# Patient Record
Sex: Female | Born: 1995 | Race: Black or African American | Hispanic: No | Marital: Single | Smoking: Never smoker
Health system: Southern US, Community
[De-identification: ages and names within clinical notes are randomized; demographics above are authoritative.]

## PROBLEM LIST (undated history)

## (undated) HISTORY — PX: CYST EXCISION: SHX5701

## (undated) HISTORY — PX: KNEE SURGERY: SHX244

---

## 2019-04-01 ENCOUNTER — Other Ambulatory Visit: Payer: Self-pay

## 2019-04-01 ENCOUNTER — Encounter (HOSPITAL_COMMUNITY): Payer: Self-pay | Admitting: Emergency Medicine

## 2019-04-01 ENCOUNTER — Emergency Department (HOSPITAL_COMMUNITY)
Admission: EM | Admit: 2019-04-01 | Discharge: 2019-04-01 | Disposition: A | Discharge status: Home / Self Care | Payer: Self-pay | Attending: Emergency Medicine | Admitting: Emergency Medicine

## 2019-04-01 DIAGNOSIS — R112 Nausea with vomiting, unspecified: Secondary | ICD-10-CM | POA: Insufficient documentation

## 2019-04-01 LAB — CBC WITH DIFFERENTIAL/PLATELET
Abs Immature Granulocytes: 0.07 10*3/uL (ref 0.00–0.07)
Basophils Absolute: 0.1 10*3/uL (ref 0.0–0.1)
Basophils Relative: 0 %
Eosinophils Absolute: 0 10*3/uL (ref 0.0–0.5)
Eosinophils Relative: 0 %
HCT: 38.8 % (ref 36.0–46.0)
Hemoglobin: 12.4 g/dL (ref 12.0–15.0)
Immature Granulocytes: 1 %
Lymphocytes Relative: 8 %
Lymphs Abs: 1.1 10*3/uL (ref 0.7–4.0)
MCH: 25.4 pg — ABNORMAL LOW (ref 26.0–34.0)
MCHC: 32 g/dL (ref 30.0–36.0)
MCV: 79.5 fL — ABNORMAL LOW (ref 80.0–100.0)
Monocytes Absolute: 0.7 10*3/uL (ref 0.1–1.0)
Monocytes Relative: 5 %
Neutro Abs: 11.2 10*3/uL — ABNORMAL HIGH (ref 1.7–7.7)
Neutrophils Relative %: 86 %
Platelets: 284 10*3/uL (ref 150–400)
RBC: 4.88 MIL/uL (ref 3.87–5.11)
RDW: 14.5 % (ref 11.5–15.5)
WBC: 13 10*3/uL — ABNORMAL HIGH (ref 4.0–10.5)
nRBC: 0 % (ref 0.0–0.2)

## 2019-04-01 LAB — URINALYSIS, ROUTINE W REFLEX MICROSCOPIC
Bilirubin Urine: NEGATIVE
Glucose, UA: NEGATIVE mg/dL
Hgb urine dipstick: NEGATIVE
Ketones, ur: 20 mg/dL — AB
Leukocytes,Ua: NEGATIVE
Nitrite: NEGATIVE
Protein, ur: 30 mg/dL — AB
Specific Gravity, Urine: 1.019 (ref 1.005–1.030)
pH: 9 — ABNORMAL HIGH (ref 5.0–8.0)

## 2019-04-01 LAB — COMPREHENSIVE METABOLIC PANEL
ALT: 48 U/L — ABNORMAL HIGH (ref 0–44)
AST: 45 U/L — ABNORMAL HIGH (ref 15–41)
Albumin: 4.4 g/dL (ref 3.5–5.0)
Alkaline Phosphatase: 96 U/L (ref 38–126)
Anion gap: 15 (ref 5–15)
BUN: 10 mg/dL (ref 6–20)
CO2: 16 mmol/L — ABNORMAL LOW (ref 22–32)
Calcium: 9.4 mg/dL (ref 8.9–10.3)
Chloride: 106 mmol/L (ref 98–111)
Creatinine, Ser: 0.88 mg/dL (ref 0.44–1.00)
GFR calc Af Amer: >60 mL/min (ref >60)
GFR calc non Af Amer: >60 mL/min (ref >60)
Glucose, Bld: 122 mg/dL — ABNORMAL HIGH (ref 70–99)
Potassium: 3.6 mmol/L (ref 3.5–5.1)
Sodium: 137 mmol/L (ref 135–145)
Total Bilirubin: 0.7 mg/dL (ref 0.3–1.2)
Total Protein: 8.2 g/dL — ABNORMAL HIGH (ref 6.5–8.1)

## 2019-04-01 LAB — CBG MONITORING, ED: Glucose-Capillary: 121 mg/dL — ABNORMAL HIGH (ref 70–99)

## 2019-04-01 LAB — I-STAT BETA HCG BLOOD, ED (MC, WL, AP ONLY): I-stat hCG, quantitative: <5 m[IU]/mL (ref <5)

## 2019-04-01 LAB — LIPASE, BLOOD: Lipase: 21 U/L (ref 11–51)

## 2019-04-01 MED ORDER — SODIUM CHLORIDE 0.9 % IV BOLUS
1000.0000 mL | Freq: Once | INTRAVENOUS | Status: AC
  Administered 2019-04-01: 09:00:00 1000 mL via INTRAVENOUS

## 2019-04-01 MED ORDER — PROMETHAZINE HCL 25 MG PO TABS: 25.0000 mg | ORAL_TABLET | Freq: Four times a day (QID) | ORAL | 0 refills | Status: DC | PRN

## 2019-04-01 MED ORDER — KETOROLAC TROMETHAMINE 30 MG/ML IJ SOLN
30.0000 mg | Freq: Once | INTRAMUSCULAR | Status: AC
  Administered 2019-04-01: 30 mg via INTRAVENOUS
  Filled 2019-04-01: qty 1

## 2019-04-01 MED ORDER — ONDANSETRON HCL 4 MG/2ML IJ SOLN
4.0000 mg | Freq: Once | INTRAMUSCULAR | Status: AC
  Administered 2019-04-01: 4 mg via INTRAVENOUS
  Filled 2019-04-01: qty 2

## 2019-04-01 MED ORDER — FAMOTIDINE IN NACL 20-0.9 MG/50ML-% IV SOLN
20.0000 mg | Freq: Once | INTRAVENOUS | Status: AC
  Administered 2019-04-01: 20 mg via INTRAVENOUS
  Filled 2019-04-01: qty 50

## 2019-04-01 MED ORDER — METOCLOPRAMIDE HCL 5 MG/ML IJ SOLN
10.0000 mg | Freq: Once | INTRAMUSCULAR | Status: AC
  Administered 2019-04-01: 10 mg via INTRAVENOUS
  Filled 2019-04-01: qty 2

## 2019-04-01 MED ORDER — MORPHINE SULFATE (PF) 2 MG/ML IV SOLN
2.0000 mg | Freq: Once | INTRAVENOUS | Status: AC
  Administered 2019-04-01: 2 mg via INTRAVENOUS
  Filled 2019-04-01: qty 1

## 2019-04-01 NOTE — ED Triage Notes (Signed)
Pt arrives to ED from home with complaints of emesis since 11pm last night after taking six shots of liquor.

## 2019-04-01 NOTE — Discharge Instructions (Addendum)
You were seen in the ER for nausea, vomiting.   I suspect this is a virus or related to alcohol intake or stomach irritation  Gentle diet. Stay hydrated drink 2 L of water daily.  Phenergan every 6 hours for nausea for the next 48 hours to help with nausea and hydration. Take tylenol for pain every 6 hours, avoid ibuprofen or aspirin products because these irritate stomach

## 2019-04-01 NOTE — ED Notes (Signed)
Patient given water for PO fluid challenge.

## 2019-04-01 NOTE — ED Provider Notes (Signed)
[facility]   CSN: [id number] Arrival date & time: 04/01/19  0830    History   Chief Complaint Chief Complaint  Patient presents with  . Alcohol Intoxication    HPI Stacey Franco is a 23 y.o. female brought to the ER by EMS for evaluation of persistent nausea associated with nonbilious nonbloody emesis onset 11 PM last night.  She has vomited more than 10 times.  Other associated symptoms include burning in her stomach with active vomiting but no significant, constant abdominal pain, and a couple episodes of small volume non bloody non melenotic diarrhea through the night.  She feels dehydrated.  Admits to taking at least 6 shots of liquor yesterday starting around 6 PM.  She has tried Dramamine, ibuprofen without improvement in her nausea or vomiting.  She denies any fevers, chills, cough, sore throat, chest pain, shortness of breath, hematemesis, melena, dysuria.  No sick contacts.  No recent intake of suspicious foods. No exposure to suspected or confirmed COVID-19.  No other medical conditions or daily medicines.  She smokes marijuana at least once a day.  Occasional EtOH use not usually in large amounts.    HPI  History reviewed. No pertinent past medical history.  There are no active problems to display for this patient.   History reviewed. No pertinent surgical history.   OB History   No obstetric history on file.      Home Medications    Prior to Admission medications   Medication Sig Start Date End Date Taking? Authorizing Provider  promethazine (PHENERGAN) 25 MG tablet Take 1 tablet (25 mg total) by mouth every 6 (six) hours as needed for nausea or vomiting. 04/01/19   Liberty HandyGibbons, Claudia J, PA-C    Family History History reviewed. No pertinent family history.  Social History Social History   Tobacco Use  . Smoking status: Not on file  Substance Use Topics  . Alcohol use: Not on file  . Drug use: Not on file      Allergies   Patient has no known allergies.   Review of Systems Review of Systems  Gastrointestinal: Positive for abdominal pain, diarrhea, nausea and vomiting.  All other systems reviewed and are negative.    Physical Exam Updated Vital Signs BP (!) 139/95 (BP Location: Right Arm)   Pulse 92   Temp 98.5 F (36.9 C) (Oral)   Resp 16   Ht 5\' 2"  (1.575 m)   Wt 86.2 kg   SpO2 100%   BMI 34.75 kg/m   Physical Exam Vitals signs and nursing note reviewed.  Constitutional:      Appearance: She is well-developed. She is diaphoretic.     Comments: Active, forceful vomiting.  Forehead diaphoresis.  Appears uncomfortable but nontoxic.  HENT:     Head: Normocephalic and atraumatic.     Nose: Nose normal.     Mouth/Throat:     Mouth: Mucous membranes are dry.     Comments: Dry lips but moist mucous membranes. Eyes:     Conjunctiva/sclera: Conjunctivae normal.  Neck:     Musculoskeletal: Normal range of motion.  Cardiovascular:     Rate and Rhythm: Normal rate and regular rhythm.  Pulmonary:     Effort: Pulmonary effort is normal.     Breath sounds: Normal breath sounds.  Abdominal:     General: Bowel sounds are normal.     Palpations: Abdomen is soft.     Tenderness: There is no abdominal  tenderness.     Comments: No G/R/R. No suprapubic or CVA tenderness. Negative Murphy's and McBurney's. Active BS to lower quadrants.   Musculoskeletal: Normal range of motion.  Skin:    General: Skin is warm.     Capillary Refill: Capillary refill takes less than 2 seconds.  Neurological:     Mental Status: She is alert.  Psychiatric:        Behavior: Behavior normal.      ED Treatments / Results  Labs (all labs ordered are listed, but only abnormal results are displayed) Labs Reviewed  CBC WITH DIFFERENTIAL/PLATELET - Abnormal; Notable for the following components:      Result Value   WBC 13.0 (*)    MCV 79.5 (*)    MCH 25.4 (*)    Neutro Abs 11.2 (*)    All other  components within normal limits  COMPREHENSIVE METABOLIC PANEL - Abnormal; Notable for the following components:   CO2 16 (*)    Glucose, Bld 122 (*)    Total Protein 8.2 (*)    AST 45 (*)    ALT 48 (*)    All other components within normal limits  URINALYSIS, ROUTINE W REFLEX MICROSCOPIC - Abnormal; Notable for the following components:   APPearance HAZY (*)    pH 9.0 (*)    Ketones, ur 20 (*)    Protein, ur 30 (*)    Bacteria, UA RARE (*)    All other components within normal limits  CBG MONITORING, ED - Abnormal; Notable for the following components:   Glucose-Capillary 121 (*)    All other components within normal limits  LIPASE, BLOOD  I-STAT BETA HCG BLOOD, ED ([facility], [facility], [facility] ONLY)    EKG None  Radiology No results found.  Procedures Procedures (including critical care time)  Medications Ordered in ED Medications  ondansetron (ZOFRAN) injection 4 mg (4 mg Intravenous Given 04/01/19 0851)  sodium chloride 0.9 % bolus 1,000 mL (0 mLs Intravenous Stopped 04/01/19 1056)  famotidine (PEPCID) IVPB 20 mg premix (0 mg Intravenous Stopped 04/01/19 0940)  metoCLOPramide (REGLAN) injection 10 mg (10 mg Intravenous Given 04/01/19 1017)  ketorolac (TORADOL) 30 MG/ML injection 30 mg (30 mg Intravenous Given 04/01/19 1019)  morphine 2 MG/ML injection 2 mg (2 mg Intravenous Given 04/01/19 1118)     Initial Impression / Assessment and Plan / ED Course  I have reviewed the triage vital signs and the nursing notes.  Pertinent labs & imaging results that were available during my care of the patient were reviewed by me and considered in my medical decision making (see chart for details).  Highest on DDX is EtOH related gastritis, hangover.  She uses daily marijuana so cannabinoid hyperemesis syndrome also on differential.  No fever or other infectious symptoms however viral gastroenteritis also included.  No sick contacts to COVID-19, no suspicious intake of food, travel.  Abdominal exam is  benign making pancreatitis, cholecystitis, appendicitis, SBO or diverticulitis less likely.  Lab work ordered.  She is clinically sober and ETOH level unlikely to change management. Zofran, Pepcid, IV fluids initiated.  Will reassess.  Patient re-evaluated. She had significant improvement in symptoms. Repeat abd exam benign. Labs reviewed by me, unremarkable. She is tolerating PO. Given improvement, benign work up further emergent work up/imaging not thought to be indicated. DC with phenergan, gentle diet, oral hydration. Return precautions given, pt comfortable with plan.  Final Clinical Impressions(s) / ED Diagnoses   Final diagnoses:  Nausea and vomiting in adult  ED Discharge Orders         Ordered    promethazine (PHENERGAN) 25 MG tablet  Every 6 hours PRN     04/01/19 1259           Arlean Hopping 04/01/19 1304    Blanchie Dessert, MD 04/01/19 2130

## 2019-04-01 NOTE — ED Notes (Signed)
Patient verbalizes understanding of discharge instructions. Opportunity for questioning and answers were provided. Armband removed by staff, pt discharged from ED.  

## 2019-04-03 ENCOUNTER — Encounter (HOSPITAL_COMMUNITY): Payer: Self-pay | Admitting: *Deleted

## 2019-04-03 ENCOUNTER — Emergency Department (HOSPITAL_COMMUNITY)
Admission: EM | Admit: 2019-04-03 | Discharge: 2019-04-03 | Disposition: A | Discharge status: Home / Self Care | Payer: No Typology Code available for payment source | Attending: Emergency Medicine | Admitting: Emergency Medicine

## 2019-04-03 ENCOUNTER — Other Ambulatory Visit: Payer: Self-pay

## 2019-04-03 DIAGNOSIS — F1210 Cannabis abuse, uncomplicated: Secondary | ICD-10-CM | POA: Diagnosis not present

## 2019-04-03 DIAGNOSIS — R112 Nausea with vomiting, unspecified: Secondary | ICD-10-CM | POA: Diagnosis not present

## 2019-04-03 LAB — CBC
HCT: 39.9 % (ref 36.0–46.0)
Hemoglobin: 13 g/dL (ref 12.0–15.0)
MCH: 25.6 pg — ABNORMAL LOW (ref 26.0–34.0)
MCHC: 32.6 g/dL (ref 30.0–36.0)
MCV: 78.7 fL — ABNORMAL LOW (ref 80.0–100.0)
Platelets: 286 10*3/uL (ref 150–400)
RBC: 5.07 MIL/uL (ref 3.87–5.11)
RDW: 14.1 % (ref 11.5–15.5)
WBC: 10.7 10*3/uL — ABNORMAL HIGH (ref 4.0–10.5)
nRBC: 0 % (ref 0.0–0.2)

## 2019-04-03 LAB — COMPREHENSIVE METABOLIC PANEL
ALT: 31 U/L (ref 0–44)
AST: 24 U/L (ref 15–41)
Albumin: 4 g/dL (ref 3.5–5.0)
Alkaline Phosphatase: 73 U/L (ref 38–126)
Anion gap: 13 (ref 5–15)
BUN: 10 mg/dL (ref 6–20)
CO2: 21 mmol/L — ABNORMAL LOW (ref 22–32)
Calcium: 9.3 mg/dL (ref 8.9–10.3)
Chloride: 101 mmol/L (ref 98–111)
Creatinine, Ser: 0.95 mg/dL (ref 0.44–1.00)
GFR calc Af Amer: >60 mL/min (ref >60)
GFR calc non Af Amer: >60 mL/min (ref >60)
Glucose, Bld: 86 mg/dL (ref 70–99)
Potassium: 3.1 mmol/L — ABNORMAL LOW (ref 3.5–5.1)
Sodium: 135 mmol/L (ref 135–145)
Total Bilirubin: 1.2 mg/dL (ref 0.3–1.2)
Total Protein: 7.8 g/dL (ref 6.5–8.1)

## 2019-04-03 LAB — URINALYSIS, ROUTINE W REFLEX MICROSCOPIC
Bilirubin Urine: NEGATIVE
Glucose, UA: NEGATIVE mg/dL
Hgb urine dipstick: NEGATIVE
Ketones, ur: 80 mg/dL — AB
Leukocytes,Ua: NEGATIVE
Nitrite: NEGATIVE
Protein, ur: NEGATIVE mg/dL
Specific Gravity, Urine: 1.023 (ref 1.005–1.030)
pH: 6 (ref 5.0–8.0)

## 2019-04-03 LAB — LIPASE, BLOOD: Lipase: 24 U/L (ref 11–51)

## 2019-04-03 LAB — I-STAT BETA HCG BLOOD, ED (MC, WL, AP ONLY): I-stat hCG, quantitative: <5 m[IU]/mL (ref <5)

## 2019-04-03 MED ORDER — POTASSIUM CHLORIDE CRYS ER 20 MEQ PO TBCR
40.0000 meq | EXTENDED_RELEASE_TABLET | Freq: Once | ORAL | Status: DC
  Filled 2019-04-03: qty 2

## 2019-04-03 MED ORDER — METOCLOPRAMIDE HCL 10 MG PO TABS: 10.0000 mg | ORAL_TABLET | Freq: Two times a day (BID) | ORAL | 0 refills | Status: DC | PRN

## 2019-04-03 MED ORDER — SODIUM CHLORIDE 0.9% FLUSH
3.0000 mL | Freq: Once | INTRAVENOUS | Status: AC
  Administered 2019-04-03: 3 mL via INTRAVENOUS

## 2019-04-03 MED ORDER — METOCLOPRAMIDE HCL 5 MG/ML IJ SOLN
10.0000 mg | Freq: Once | INTRAMUSCULAR | Status: AC
  Administered 2019-04-03: 10 mg via INTRAVENOUS
  Filled 2019-04-03: qty 2

## 2019-04-03 MED ORDER — SODIUM CHLORIDE 0.9 % IV SOLN
Freq: Once | INTRAVENOUS | Status: AC
  Administered 2019-04-03: 16:00:00 via INTRAVENOUS

## 2019-04-03 MED ORDER — FAMOTIDINE IN NACL 20-0.9 MG/50ML-% IV SOLN
20.0000 mg | Freq: Once | INTRAVENOUS | Status: AC
  Administered 2019-04-03: 17:00:00 20 mg via INTRAVENOUS
  Filled 2019-04-03: qty 50

## 2019-04-03 NOTE — Discharge Instructions (Signed)

## 2019-04-03 NOTE — ED Triage Notes (Signed)
Pt was seen on 7/27 for n/v. Reports no relief with prescriptions and now feels dehydrated.

## 2019-04-03 NOTE — ED Provider Notes (Addendum)
[facility] Provider Note   CSN: [id number] Arrival date & time: 04/03/19  1333    History   Chief Complaint Chief Complaint  Patient presents with  . Emesis    HPI Stacey Franco is a 23 y.o. female.     HPI   Pt is a 23 y/o female who presents to the ED today c/o NV. States she started having NV about 4 days ago. She was seen in the ED on 7/27 and had reassuring workup. sxs were thought to be 2/2 gastritis versus a hangover. She was given rx for phenergan and she states that her symptoms have not improved. She has continued to have vomiting when she eats. She has had about 5 episodes of vomiting in the last 24 hours. States she drinks ETOH and thinks that is what started her sxs originally. She has not had ETOH since her sxs started. Denies abnormal vaginal discharge or bleeding.   Denies abd pain, hematemesis, fevers, diarrhea, constipation, dysuria, frequency, urgency.  Last BM was about 3 days ago and it was watery. She reports associated reflux sxs.  She states she uses marijuana and last used about 2 days ago.   History reviewed. No pertinent past medical history.  There are no active problems to display for this patient.  History reviewed. No pertinent surgical history.   OB History   No obstetric history on file.      Home Medications    Prior to Admission medications   Medication Sig Start Date End Date Taking? Authorizing Provider  metoCLOPramide (REGLAN) 10 MG tablet Take 1 tablet (10 mg total) by mouth every 12 (twelve) hours as needed for nausea. 04/03/19   Ecko Beasley S, PA-C  promethazine (PHENERGAN) 25 MG tablet Take 1 tablet (25 mg total) by mouth every 6 (six) hours as needed for nausea or vomiting. 04/01/19   Liberty HandyGibbons, Claudia J, PA-C    Family History History reviewed. No pertinent family history.  Social History Social History   Tobacco Use  . Smoking status: Not on file  Substance Use Topics  .  Alcohol use: Not on file  . Drug use: Not on file     Allergies   Patient has no known allergies.   Review of Systems Review of Systems  Constitutional: Negative for chills and fever.  HENT: Negative for ear pain and sore throat.   Eyes: Negative for visual disturbance.  Respiratory: Negative for cough and shortness of breath.   Cardiovascular: Negative for chest pain.  Gastrointestinal: Positive for nausea and vomiting. Negative for abdominal pain, constipation and diarrhea.  Genitourinary: Negative for dysuria, hematuria and urgency.  Musculoskeletal: Negative for arthralgias and back pain.  Skin: Negative for rash.  Neurological: Negative for headaches.  All other systems reviewed and are negative.    Physical Exam Updated Vital Signs BP (!) 136/98 (BP Location: Right Arm)   Pulse 83   Temp 98.2 F (36.8 C) (Oral)   Resp 20   LMP 03/20/2019   SpO2 100%   Physical Exam Vitals signs and nursing note reviewed.  Constitutional:      General: She is not in acute distress.    Appearance: She is well-developed. She is not ill-appearing or toxic-appearing.  HENT:     Head: Normocephalic and atraumatic.  Eyes:     Conjunctiva/sclera: Conjunctivae normal.  Neck:     Musculoskeletal: Neck supple.  Cardiovascular:     Rate and Rhythm: Normal rate and regular  rhythm.     Pulses: Normal pulses.     Heart sounds: Normal heart sounds. No murmur.  Pulmonary:     Effort: Pulmonary effort is normal. No respiratory distress.     Breath sounds: Normal breath sounds. No wheezing, rhonchi or rales.  Abdominal:     General: Bowel sounds are normal. There is no distension.     Palpations: Abdomen is soft.     Tenderness: There is no abdominal tenderness. There is no right CVA tenderness, left CVA tenderness, guarding or rebound.  Skin:    General: Skin is warm and dry.  Neurological:     Mental Status: She is alert.      ED Treatments / Results  Labs (all labs ordered are  listed, but only abnormal results are displayed) Labs Reviewed  COMPREHENSIVE METABOLIC PANEL - Abnormal; Notable for the following components:      Result Value   Potassium 3.1 (*)    CO2 21 (*)    All other components within normal limits  CBC - Abnormal; Notable for the following components:   WBC 10.7 (*)    MCV 78.7 (*)    MCH 25.6 (*)    All other components within normal limits  URINALYSIS, ROUTINE W REFLEX MICROSCOPIC - Abnormal; Notable for the following components:   Ketones, ur 80 (*)    All other components within normal limits  LIPASE, BLOOD  I-STAT BETA HCG BLOOD, ED ([facility], [facility], [facility] ONLY)    EKG None  Radiology No results found.  Procedures Procedures (including critical care time)  Medications Ordered in ED Medications  potassium chloride SA (K-DUR) CR tablet 40 mEq (40 mEq Oral Total Dose 04/03/19 1643)  sodium chloride flush (NS) 0.9 % injection 3 mL (3 mLs Intravenous Given 04/03/19 1526)  0.9 %  sodium chloride infusion ( Intravenous Bolus from Bag 04/03/19 1600)  metoCLOPramide (REGLAN) injection 10 mg (10 mg Intravenous Given 04/03/19 1639)  famotidine (PEPCID) IVPB 20 mg premix (0 mg Intravenous Stopped 04/03/19 1718)     Initial Impression / Assessment and Plan / ED Course  I have reviewed the triage vital signs and the nursing notes.  Pertinent labs & imaging results that were available during my care of the patient were reviewed by me and considered in my medical decision making (see chart for details).    Final Clinical Impressions(s) / ED Diagnoses   Final diagnoses:  Non-intractable vomiting with nausea, unspecified vomiting type   Patient is nontoxic, nonseptic appearing, in no apparent distress.  Patient's pain and other symptoms adequately managed in emergency department.  Fluid bolus given.  Labs, imaging and vitals reviewed.  Patient does not meet the SIRS or Sepsis criteria.  On repeat exam patient does not have a surgical abdomin and there  are no peritoneal signs.  No indication of appendicitis, bowel obstruction, bowel perforation, cholecystitis, diverticulitis, PID or ectopic pregnancy.  Patient discharged home with symptomatic treatment and given strict instructions for follow-up with their primary care physician.  I have also discussed reasons to return immediately to the ER.  Patient expresses understanding and agrees with plan.  ED Discharge Orders         Ordered    metoCLOPramide (REGLAN) 10 MG tablet  Every 12 hours PRN     04/03/19 1812           Rodney Booze, PA-C 04/03/19 [address] Haseeb Fiallos S, PA-C 04/03/19 Finney, Ankit, MD 04/04/19  1419  

## 2019-04-04 ENCOUNTER — Telehealth: Payer: Self-pay | Admitting: *Deleted

## 2019-04-04 NOTE — Telephone Encounter (Signed)
Pt called regarding misplaced Rx for reglan.  EDCM called in to pharmacy of choice- [facility].

## 2019-07-06 ENCOUNTER — Other Ambulatory Visit: Payer: Self-pay

## 2019-07-06 ENCOUNTER — Emergency Department (HOSPITAL_COMMUNITY)
Admission: EM | Admit: 2019-07-06 | Discharge: 2019-07-06 | Disposition: A | Discharge status: Home / Self Care | Payer: No Typology Code available for payment source | Attending: Emergency Medicine | Admitting: Emergency Medicine

## 2019-07-06 DIAGNOSIS — R112 Nausea with vomiting, unspecified: Secondary | ICD-10-CM | POA: Diagnosis present

## 2019-07-06 DIAGNOSIS — R197 Diarrhea, unspecified: Secondary | ICD-10-CM | POA: Insufficient documentation

## 2019-07-06 LAB — CBC
HCT: 39.3 % (ref 36.0–46.0)
Hemoglobin: 12.5 g/dL (ref 12.0–15.0)
MCH: 25.5 pg — ABNORMAL LOW (ref 26.0–34.0)
MCHC: 31.8 g/dL (ref 30.0–36.0)
MCV: 80 fL (ref 80.0–100.0)
Platelets: 284 10*3/uL (ref 150–400)
RBC: 4.91 MIL/uL (ref 3.87–5.11)
RDW: 15.2 % (ref 11.5–15.5)
WBC: 11 10*3/uL — ABNORMAL HIGH (ref 4.0–10.5)
nRBC: 0 % (ref 0.0–0.2)

## 2019-07-06 LAB — COMPREHENSIVE METABOLIC PANEL
ALT: 19 U/L (ref 0–44)
AST: 23 U/L (ref 15–41)
Albumin: 4.2 g/dL (ref 3.5–5.0)
Alkaline Phosphatase: 84 U/L (ref 38–126)
Anion gap: 13 (ref 5–15)
BUN: 10 mg/dL (ref 6–20)
CO2: 17 mmol/L — ABNORMAL LOW (ref 22–32)
Calcium: 9.2 mg/dL (ref 8.9–10.3)
Chloride: 107 mmol/L (ref 98–111)
Creatinine, Ser: 0.77 mg/dL (ref 0.44–1.00)
GFR calc Af Amer: >60 mL/min (ref >60)
GFR calc non Af Amer: >60 mL/min (ref >60)
Glucose, Bld: 122 mg/dL — ABNORMAL HIGH (ref 70–99)
Potassium: 3.6 mmol/L (ref 3.5–5.1)
Sodium: 137 mmol/L (ref 135–145)
Total Bilirubin: 0.5 mg/dL (ref 0.3–1.2)
Total Protein: 8.2 g/dL — ABNORMAL HIGH (ref 6.5–8.1)

## 2019-07-06 LAB — I-STAT BETA HCG BLOOD, ED (MC, WL, AP ONLY): I-stat hCG, quantitative: <5 m[IU]/mL (ref <5)

## 2019-07-06 LAB — LIPASE, BLOOD: Lipase: 25 U/L (ref 11–51)

## 2019-07-06 MED ORDER — METOCLOPRAMIDE HCL 10 MG PO TABS: 10.0000 mg | ORAL_TABLET | Freq: Three times a day (TID) | ORAL | 0 refills | Status: DC | PRN

## 2019-07-06 MED ORDER — ONDANSETRON 4 MG PO TBDP: 4.0000 mg | ORAL_TABLET | Freq: Once | ORAL | Status: DC | PRN

## 2019-07-06 MED ORDER — METOCLOPRAMIDE HCL 5 MG/ML IJ SOLN
10.0000 mg | Freq: Once | INTRAMUSCULAR | Status: AC
  Administered 2019-07-06: 14:00:00 10 mg via INTRAVENOUS
  Filled 2019-07-06: qty 2

## 2019-07-06 MED ORDER — SODIUM CHLORIDE 0.9 % IV BOLUS
1000.0000 mL | Freq: Once | INTRAVENOUS | Status: AC
  Administered 2019-07-06: 14:00:00 1000 mL via INTRAVENOUS

## 2019-07-06 NOTE — Discharge Instructions (Signed)
Follow up with your primary care doctor to discuss your hospital visit. Continue to hydrate orally with small sips of fluids throughout the day. Use Reglan as directed for nausea & vomiting.   The 'BRAT' diet is suggested, then progress to diet as tolerated as symptoms abate.  Bananas.  Rice.  Applesauce.  Toast (and other simple starches such as crackers, potatoes, noodles).   SEEK IMMEDIATE MEDICAL ATTENTION IF: You begin having localized abdominal pain that does not go away or becomes severe You develop a fever Repeated vomiting occurs despite medication (multiple uncontrollable episodes) or you are unable to keep fluids down Blood is being passed in stools or vomit (bright red or black tarry stools).  New or worsening symptoms develop or you have any additional concerns.

## 2019-07-06 NOTE — ED Triage Notes (Signed)
Pt endorses n/v that began around 0900 this morning. Thinks it may be food poisoning. VSS. LMP 07/20/19

## 2019-07-06 NOTE — ED Provider Notes (Signed)
[facility] EMERGENCY DEPARTMENT Provider Note   CSN: [id number] Arrival date & time: 07/06/19  1251     History   Chief Complaint Chief Complaint  Patient presents with  . Emesis    HPI Stacey Franco is a 23 y.o. female.     The history is provided by the patient and medical records. No language interpreter was used.  Emesis Associated symptoms: diarrhea   Associated symptoms: no abdominal pain    Stacey Franco is a 23 y.o. female  with no pertinent past medical history who presents to the Emergency Department complaining of nausea and vomiting which began early this morning.  Denies abdominal pain.  Does report about 4-5 loose, nonbloody stools this morning as well.  No medications taken prior to arrival.  Has tried to stay hydrated with electrolyte waters, but continued to throw up.  Reports history of similar this summer, however this occurred after a night of heavy drinking.  Denies any alcohol use today or yesterday. No fevers.   No past medical history on file.  There are no active problems to display for this patient.   No past surgical history on file.   OB History   No obstetric history on file.      Home Medications    Prior to Admission medications   Medication Sig Start Date End Date Taking? Authorizing Provider  metoCLOPramide (REGLAN) 10 MG tablet Take 1 tablet (10 mg total) by mouth every 8 (eight) hours as needed for nausea or vomiting. 07/06/19   Ward, Ozella Almond, PA-C    Family History No family history on file.  Social History Social History   Tobacco Use  . Smoking status: Not on file  Substance Use Topics  . Alcohol use: Not on file  . Drug use: Not on file     Allergies   Patient has no known allergies.   Review of Systems Review of Systems  Gastrointestinal: Positive for diarrhea, nausea and vomiting. Negative for abdominal pain.  All other systems reviewed and are negative.    Physical Exam  Updated Vital Signs BP 136/72   Pulse 91   Temp 97.7 F (36.5 C) (Oral)   Resp 18   LMP 06/19/2019 (Exact Date)   SpO2 100%   Physical Exam Vitals signs and nursing note reviewed.  Constitutional:      General: She is not in acute distress.    Appearance: She is well-developed.     Comments: Nontoxic-appearing.  HENT:     Head: Normocephalic and atraumatic.  Neck:     Musculoskeletal: Neck supple.  Cardiovascular:     Rate and Rhythm: Normal rate and regular rhythm.     Heart sounds: Normal heart sounds. No murmur.  Pulmonary:     Effort: Pulmonary effort is normal. No respiratory distress.     Breath sounds: Normal breath sounds.  Abdominal:     General: There is no distension.     Palpations: Abdomen is soft.     Comments: No abdominal tenderness.  No flank or CVA tenderness.  Skin:    General: Skin is warm and dry.  Neurological:     Mental Status: She is alert and oriented to person, place, and time.      ED Treatments / Results  Labs (all labs ordered are listed, but only abnormal results are displayed) Labs Reviewed  COMPREHENSIVE METABOLIC PANEL - Abnormal; Notable for the following components:      Result Value  CO2 17 (*)    Glucose, Bld 122 (*)    Total Protein 8.2 (*)    All other components within normal limits  CBC - Abnormal; Notable for the following components:   WBC 11.0 (*)    MCH 25.5 (*)    All other components within normal limits  LIPASE, BLOOD  I-STAT BETA HCG BLOOD, ED ([facility], [facility], [facility] ONLY)    EKG None  Radiology No results found.  Procedures Procedures (including critical care time)  Medications Ordered in ED Medications  sodium chloride 0.9 % bolus 1,000 mL (1,000 mLs Intravenous New Bag/Given 07/06/19 1354)  metoCLOPramide (REGLAN) injection 10 mg (10 mg Intravenous Given 07/06/19 1354)     Initial Impression / Assessment and Plan / ED Course  I have reviewed the triage vital signs and the nursing notes.  Pertinent  labs & imaging results that were available during my care of the patient were reviewed by me and considered in my medical decision making (see chart for details).       Stacey Franco is a 23 y.o. female who presents to ED for nausea, vomiting and non-bloody diarrhea beginning today. On exam, patient is afebrile, non-toxic appearing with benign abdominal exam. IV fluids and nausea medications given. Labs reviewed and reassuring. On repeat examination, patient is now tolerating PO with no episodes of emesis since medication administration. Repeat abdominal exam benign. Evaluation does not show pathology that would require ongoing emergent intervention or inpatient treatment. Rx for Reglan given which has worked well for her in the past. PCP follow up encouraged. Return precautions discussed. Patient understands diagnosis and plan of care as dictated above. All questions answered.   Final Clinical Impressions(s) / ED Diagnoses   Final diagnoses:  Nausea vomiting and diarrhea    ED Discharge Orders         Ordered    metoCLOPramide (REGLAN) 10 MG tablet  Every 8 hours PRN     07/06/19 1446           Ward, Chase Picket, PA-C 07/06/19 1448    Margarita Grizzle, MD 07/06/19 1550

## 2019-07-06 NOTE — ED Notes (Signed)
Patient verbalizes understanding of discharge instructions. Opportunity for questioning and answers were provided. Armband removed by staff, pt discharged from ED.  

## 2020-06-30 ENCOUNTER — Encounter (HOSPITAL_COMMUNITY): Payer: Self-pay

## 2020-06-30 ENCOUNTER — Encounter (HOSPITAL_COMMUNITY): Payer: Self-pay | Admitting: Emergency Medicine

## 2020-06-30 ENCOUNTER — Emergency Department (HOSPITAL_COMMUNITY): Payer: No Typology Code available for payment source

## 2020-06-30 ENCOUNTER — Ambulatory Visit (HOSPITAL_COMMUNITY)
Admission: EM | Admit: 2020-06-30 | Discharge: 2020-06-30 | Disposition: A | Discharge status: Home / Self Care | Payer: No Typology Code available for payment source

## 2020-06-30 ENCOUNTER — Emergency Department (HOSPITAL_COMMUNITY)
Admission: EM | Admit: 2020-06-30 | Discharge: 2020-06-30 | Disposition: A | Discharge status: Home / Self Care | Payer: No Typology Code available for payment source | Attending: Emergency Medicine | Admitting: Emergency Medicine

## 2020-06-30 ENCOUNTER — Other Ambulatory Visit: Payer: Self-pay

## 2020-06-30 DIAGNOSIS — S60912A Unspecified superficial injury of left wrist, initial encounter: Secondary | ICD-10-CM | POA: Diagnosis not present

## 2020-06-30 DIAGNOSIS — S01511A Laceration without foreign body of lip, initial encounter: Secondary | ICD-10-CM | POA: Diagnosis not present

## 2020-06-30 DIAGNOSIS — S6992XA Unspecified injury of left wrist, hand and finger(s), initial encounter: Secondary | ICD-10-CM

## 2020-06-30 DIAGNOSIS — R4020 Unspecified coma: Secondary | ICD-10-CM | POA: Diagnosis not present

## 2020-06-30 DIAGNOSIS — M542 Cervicalgia: Secondary | ICD-10-CM | POA: Diagnosis not present

## 2020-06-30 DIAGNOSIS — S00501A Unspecified superficial injury of lip, initial encounter: Secondary | ICD-10-CM | POA: Diagnosis present

## 2020-06-30 DIAGNOSIS — S0990XA Unspecified injury of head, initial encounter: Secondary | ICD-10-CM | POA: Diagnosis not present

## 2020-06-30 DIAGNOSIS — S0181XA Laceration without foreign body of other part of head, initial encounter: Secondary | ICD-10-CM

## 2020-06-30 MED ORDER — LIDOCAINE HCL 2 % IJ SOLN
10.0000 mL | Freq: Once | INTRAMUSCULAR | Status: DC
  Filled 2020-06-30: qty 20

## 2020-06-30 MED ORDER — NAPROXEN 375 MG PO TABS: 375.0000 mg | ORAL_TABLET | Freq: Two times a day (BID) | ORAL | 0 refills | Status: DC
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
## 2020-06-30 NOTE — ED Provider Notes (Signed)
..Laceration Repair  Date/Time: 06/30/2020 5:56 PM Performed by: Bill Salinas, PA-C Authorized by: Bill Salinas, PA-C   Consent:    Consent obtained:  Verbal   Consent given by:  Patient   Risks discussed:  Infection, nerve damage, need for additional repair, pain, poor cosmetic result, poor wound healing, tendon damage, vascular damage and retained foreign body Anesthesia (see MAR for exact dosages):    Anesthesia method:  Local infiltration   Local anesthetic:  Lidocaine 2% w/o epi Laceration details:    Location:  Lip   Lip location:  Lower interior lip   Length (cm):  1.5   Depth (mm):  6 Repair type:    Repair type:  Simple Pre-procedure details:    Preparation:  Patient was prepped and draped in usual sterile fashion Exploration:    Hemostasis achieved with:  Direct pressure   Wound exploration: wound explored through full range of motion and entire depth of wound probed and visualized     Wound extent: no foreign bodies/material noted, no muscle damage noted, no nerve damage noted, no tendon damage noted, no underlying fracture noted and no vascular damage noted     Contaminated: no   Treatment:    Area cleansed with:  Saline and Shur-Clens   Amount of cleaning:  Standard   Irrigation solution:  Sterile saline   Irrigation method:  Pressure wash Skin repair:    Repair method:  Sutures   Suture size:  5-0   Wound skin closure material used: Vicryl absorbable.   Suture technique:  Simple interrupted   Number of sutures:  3 Approximation:    Approximation:  Close Post-procedure details:    Patient tolerance of procedure:  Tolerated well, no immediate complications Comments:     Mucosal surface right inner lower lip.  No vermilion border involvement.  Significant swelling somewhat complicating repair.  Both lacerations alignment suggestive of through and through however unable to probe all the way through. Marland Kitchen.Laceration Repair  Date/Time: 06/30/2020 6:00  PM Performed by: Bill Salinas, PA-C Authorized by: Bill Salinas, PA-C   Consent:    Consent obtained:  Verbal   Consent given by:  Patient   Risks discussed:  Infection, pain, retained foreign body, vascular damage, tendon damage, poor cosmetic result, poor wound healing, nerve damage and need for additional repair Anesthesia (see MAR for exact dosages):    Anesthesia method:  Local infiltration   Local anesthetic:  Lidocaine 2% w/o epi Laceration details:    Location:  Lip   Lip location:  Lower exterior lip   Length (cm):  1   Depth (mm):  6 Repair type:    Repair type:  Simple Pre-procedure details:    Preparation:  Patient was prepped and draped in usual sterile fashion Exploration:    Hemostasis achieved with:  Direct pressure   Wound exploration: wound explored through full range of motion and entire depth of wound probed and visualized     Wound extent: no foreign bodies/material noted, no muscle damage noted, no nerve damage noted, no tendon damage noted, no underlying fracture noted and no vascular damage noted     Contaminated: no   Treatment:    Area cleansed with:  Saline, Shur-Clens and Betadine   Amount of cleaning:  Standard   Irrigation solution:  Sterile saline   Irrigation method:  Pressure wash Skin repair:    Repair method:  Sutures   Suture size:  6-0   Suture material:  Prolene  Suture technique:  Simple interrupted   Number of sutures:  2 Approximation:    Approximation:  Close   Vermilion border: poorly aligned   Post-procedure details:    Patient tolerance of procedure:  Tolerated well, no immediate complications Comments:     1 cm curved laceration.  Repair complicated by significant swelling.  Based on alignment of both lacerations it is suggestive of through and through however unable to probe all the way through to both lacerations.  No vermilion border involvement.   Note: Portions of this report may have been transcribed using  voice recognition software. Every effort was made to ensure accuracy; however, inadvertent computerized transcription errors may still be present.    Bill Salinas, PA-C 06/30/20 1801    Linwood Dibbles, MD 07/01/20 [phone]

## 2020-06-30 NOTE — ED Notes (Signed)
Notified mani, pa of patient
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
## 2020-06-30 NOTE — Discharge Instructions (Addendum)
The sutures on the outside will need to be removed.  Do that in about 5 days.  Apply antibiotic ointment to the wound daily.  Wear the wrist brace and schedule a follow up appointment with the orthopedic hand specialist doctor, Dr Amanda Pea.

## 2020-06-30 NOTE — Progress Notes (Signed)
Orthopedic Tech Progress Note Patient Details:  Stacey Franco Jan 03, 1996 [id number]  Ortho Devices Type of Ortho Device: Thumb velcro splint Ortho Device/Splint Location: Left Hand Ortho Device/Splint Interventions: Application, Adjustment   Post Interventions Patient Tolerated: Well Instructions Provided: Adjustment of device   Stacey Franco E [name] 06/30/2020, 6:03 PM
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
## 2020-06-30 NOTE — ED Notes (Signed)
Lidocaine given by provider
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
## 2020-06-30 NOTE — ED Notes (Signed)
Patient transported to CT and xray 

## 2021-04-20 ENCOUNTER — Emergency Department (HOSPITAL_BASED_OUTPATIENT_CLINIC_OR_DEPARTMENT_OTHER)
Admission: EM | Admit: 2021-04-20 | Discharge: 2021-04-20 | Disposition: A | Discharge status: Home / Self Care | Payer: No Typology Code available for payment source | Attending: Emergency Medicine | Admitting: Emergency Medicine

## 2021-04-20 ENCOUNTER — Ambulatory Visit (HOSPITAL_COMMUNITY)
Admission: EM | Admit: 2021-04-20 | Discharge: 2021-04-20 | Discharge status: Against Medical Advice | Payer: No Typology Code available for payment source

## 2021-04-20 ENCOUNTER — Encounter (HOSPITAL_BASED_OUTPATIENT_CLINIC_OR_DEPARTMENT_OTHER): Payer: Self-pay

## 2021-04-20 ENCOUNTER — Other Ambulatory Visit: Payer: Self-pay

## 2021-04-20 DIAGNOSIS — R112 Nausea with vomiting, unspecified: Secondary | ICD-10-CM | POA: Diagnosis present

## 2021-04-20 DIAGNOSIS — E860 Dehydration: Secondary | ICD-10-CM | POA: Insufficient documentation

## 2021-04-20 DIAGNOSIS — R1084 Generalized abdominal pain: Secondary | ICD-10-CM | POA: Insufficient documentation

## 2021-04-20 LAB — URINALYSIS, ROUTINE W REFLEX MICROSCOPIC
Bilirubin Urine: NEGATIVE
Glucose, UA: NEGATIVE mg/dL
Ketones, ur: 40 mg/dL — AB
Nitrite: NEGATIVE
Protein, ur: 30 mg/dL — AB
Specific Gravity, Urine: 1.031 — ABNORMAL HIGH (ref 1.005–1.030)
pH: 6 (ref 5.0–8.0)

## 2021-04-20 LAB — CBC WITH DIFFERENTIAL/PLATELET
Abs Immature Granulocytes: 0.05 10*3/uL (ref 0.00–0.07)
Basophils Absolute: 0 10*3/uL (ref 0.0–0.1)
Basophils Relative: 0 %
Eosinophils Absolute: 0 10*3/uL (ref 0.0–0.5)
Eosinophils Relative: 0 %
HCT: 39.7 % (ref 36.0–46.0)
Hemoglobin: 12.8 g/dL (ref 12.0–15.0)
Immature Granulocytes: 0 %
Lymphocytes Relative: 5 %
Lymphs Abs: 0.6 10*3/uL — ABNORMAL LOW (ref 0.7–4.0)
MCH: 25.2 pg — ABNORMAL LOW (ref 26.0–34.0)
MCHC: 32.2 g/dL (ref 30.0–36.0)
MCV: 78.1 fL — ABNORMAL LOW (ref 80.0–100.0)
Monocytes Absolute: 0.3 10*3/uL (ref 0.1–1.0)
Monocytes Relative: 2 %
Neutro Abs: 11.1 10*3/uL — ABNORMAL HIGH (ref 1.7–7.7)
Neutrophils Relative %: 93 %
Platelets: 301 10*3/uL (ref 150–400)
RBC: 5.08 MIL/uL (ref 3.87–5.11)
RDW: 15.4 % (ref 11.5–15.5)
WBC: 12.1 10*3/uL — ABNORMAL HIGH (ref 4.0–10.5)
nRBC: 0 % (ref 0.0–0.2)

## 2021-04-20 LAB — COMPREHENSIVE METABOLIC PANEL
ALT: 26 U/L (ref 0–44)
AST: 21 U/L (ref 15–41)
Albumin: 4.7 g/dL (ref 3.5–5.0)
Alkaline Phosphatase: 85 U/L (ref 38–126)
Anion gap: 15 (ref 5–15)
BUN: 10 mg/dL (ref 6–20)
CO2: 17 mmol/L — ABNORMAL LOW (ref 22–32)
Calcium: 9.9 mg/dL (ref 8.9–10.3)
Chloride: 104 mmol/L (ref 98–111)
Creatinine, Ser: 0.74 mg/dL (ref 0.44–1.00)
GFR, Estimated: >60 mL/min (ref >60)
Glucose, Bld: 120 mg/dL — ABNORMAL HIGH (ref 70–99)
Potassium: 3.5 mmol/L (ref 3.5–5.1)
Sodium: 136 mmol/L (ref 135–145)
Total Bilirubin: 0.8 mg/dL (ref 0.3–1.2)
Total Protein: 8.3 g/dL — ABNORMAL HIGH (ref 6.5–8.1)

## 2021-04-20 LAB — PREGNANCY, URINE: Preg Test, Ur: NEGATIVE

## 2021-04-20 LAB — LIPASE, BLOOD: Lipase: 13 U/L (ref 11–51)

## 2021-04-20 MED ORDER — ONDANSETRON 4 MG PO TBDP
4.0000 mg | ORAL_TABLET | Freq: Once | ORAL | Status: AC
  Administered 2021-04-20: 4 mg via ORAL
  Filled 2021-04-20: qty 1

## 2021-04-20 MED ORDER — SODIUM CHLORIDE 0.9 % IV BOLUS
1000.0000 mL | Freq: Once | INTRAVENOUS | Status: AC
  Administered 2021-04-20: 1000 mL via INTRAVENOUS

## 2021-04-20 MED ORDER — ONDANSETRON HCL 4 MG/2ML IJ SOLN
4.0000 mg | Freq: Once | INTRAMUSCULAR | Status: AC
  Administered 2021-04-20: 4 mg via INTRAVENOUS
  Filled 2021-04-20: qty 2

## 2021-04-20 MED ORDER — ONDANSETRON 4 MG PO TBDP: 4.0000 mg | ORAL_TABLET | Freq: Three times a day (TID) | ORAL | 0 refills | Status: DC | PRN

## 2021-04-20 NOTE — ED Notes (Signed)
PO Zofran administered to Patient. Patient instructed to let Zofran dissolve and then to attempt PO Intake when Nausea Subsides in 10-20 Minutes.

## 2021-04-20 NOTE — ED Triage Notes (Signed)
Pt reports abdominal pain, nausea and vomiting since 5:00 am today.

## 2021-04-20 NOTE — ED Provider Notes (Signed)
[facility] EMERGENCY DEPT Provider Note   CSN: [id number] Arrival date & time: 04/20/21  1619     History Chief Complaint  Patient presents with   Nausea   Abdominal Pain    Stacey Franco is a 25 y.o. female.  Pt presents to the ED today with n/v.  Pt said she woke up this am at 0500 with n/v.  She said it has continued all day today.  She has been unable to keep anything down.  Abdomen hurts a little.  She thinks it is from the vomiting.      History reviewed. No pertinent past medical history.  There are no problems to display for this patient.   Past Surgical History:  Procedure Laterality Date   CYST EXCISION     breast   KNEE SURGERY       OB History   No obstetric history on file.     No family history on file.  Social History   Tobacco Use   Smoking status: Never   Smokeless tobacco: Never  Vaping Use   Vaping Use: Never used  Substance Use Topics   Alcohol use: Yes    Comment: occasionally   Drug use: Yes    Frequency: 3.0 times per week    Types: Marijuana    Comment: used earlier today    Home Medications Prior to Admission medications   Medication Sig Start Date End Date Taking? Authorizing Provider  ondansetron (ZOFRAN ODT) 4 MG disintegrating tablet Take 1 tablet (4 mg total) by mouth every 8 (eight) hours as needed for nausea or vomiting. 04/20/21  Yes Jacalyn Lefevre, MD  metoCLOPramide (REGLAN) 10 MG tablet Take 1 tablet (10 mg total) by mouth every 8 (eight) hours as needed for nausea or vomiting. Patient not taking: Reported on 06/30/2020 07/06/19   Ward, Chase Picket, PA-C  naproxen (NAPROSYN) 375 MG tablet Take 1 tablet (375 mg total) by mouth 2 (two) times daily. 06/30/20   Linwood Dibbles, MD  PRESCRIPTION MEDICATION Take 1 tablet by mouth daily.    [provider]    Allergies    Patient has no known allergies.  Review of Systems   Review of Systems  Gastrointestinal:  Positive for abdominal pain,  nausea and vomiting.  All other systems reviewed and are negative.  Physical Exam Updated Vital Signs BP 138/88 (BP Location: Right Arm)   Pulse 87   Temp 98.5 F (36.9 C)   Resp 18   SpO2 100%   Physical Exam Vitals and nursing note reviewed.  Constitutional:      Appearance: She is well-developed. She is obese.  HENT:     Head: Normocephalic and atraumatic.     Mouth/Throat:     Mouth: Mucous membranes are dry.  Eyes:     Extraocular Movements: Extraocular movements intact.     Pupils: Pupils are equal, round, and reactive to light.  Cardiovascular:     Rate and Rhythm: Normal rate and regular rhythm.     Heart sounds: Normal heart sounds.  Pulmonary:     Effort: Pulmonary effort is normal.     Breath sounds: Normal breath sounds.  Abdominal:     General: Abdomen is flat. Bowel sounds are normal.     Palpations: Abdomen is soft.     Tenderness: There is generalized abdominal tenderness.  Skin:    General: Skin is warm.     Capillary Refill: Capillary refill takes less than 2 seconds.  Neurological:  General: No focal deficit present.     Mental Status: She is alert and oriented to person, place, and time.  Psychiatric:        Mood and Affect: Mood normal.        Behavior: Behavior normal.    ED Results / Procedures / Treatments   Labs (all labs ordered are listed, but only abnormal results are displayed) Labs Reviewed  COMPREHENSIVE METABOLIC PANEL - Abnormal; Notable for the following components:      Result Value   CO2 17 (*)    Glucose, Bld 120 (*)    Total Protein 8.3 (*)    All other components within normal limits  CBC WITH DIFFERENTIAL/PLATELET - Abnormal; Notable for the following components:   WBC 12.1 (*)    MCV 78.1 (*)    MCH 25.2 (*)    Neutro Abs 11.1 (*)    Lymphs Abs 0.6 (*)    All other components within normal limits  URINALYSIS, ROUTINE W REFLEX MICROSCOPIC - Abnormal; Notable for the following components:   Specific Gravity, Urine  1.031 (*)    Hgb urine dipstick SMALL (*)    Ketones, ur 40 (*)    Protein, ur 30 (*)    Leukocytes,Ua SMALL (*)    Bacteria, UA RARE (*)    All other components within normal limits  LIPASE, BLOOD  PREGNANCY, URINE    EKG None  Radiology No results found.  Procedures Procedures   Medications Ordered in ED Medications  sodium chloride 0.9 % bolus 1,000 mL (0 mLs Intravenous Stopped 04/20/21 2127)  ondansetron (ZOFRAN) injection 4 mg (4 mg Intravenous Given 04/20/21 1837)  ondansetron (ZOFRAN-ODT) disintegrating tablet 4 mg (4 mg Oral Given 04/20/21 2127)    ED Course  I have reviewed the triage vital signs and the nursing notes.  Pertinent labs & imaging results that were available during my care of the patient were reviewed by me and considered in my medical decision making (see chart for details).    MDM Rules/Calculators/A&P                           Pt is feeling better after fluids and meds.  She is stable for d/c.  Return if worse.  Final Clinical Impression(s) / ED Diagnoses Final diagnoses:  Non-intractable vomiting with nausea, unspecified vomiting type  Dehydration    Rx / DC Orders ED Discharge Orders          Ordered    ondansetron (ZOFRAN ODT) 4 MG disintegrating tablet  Every 8 hours PRN        04/20/21 2219             Jacalyn Lefevre, MD 04/20/21 2220

## 2021-04-20 NOTE — ED Notes (Signed)
States ate a sandwich last night and woke up vomiting.   States has been vomiting a lot all day with some diarrhea.  Denies abdominal pain but has constant nausea.
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
## 2022-06-04 ENCOUNTER — Emergency Department (HOSPITAL_COMMUNITY)

## 2022-06-04 ENCOUNTER — Emergency Department (HOSPITAL_COMMUNITY)
Admission: EM | Admit: 2022-06-04 | Discharge: 2022-06-04 | Disposition: A | Discharge status: Home / Self Care | Attending: Emergency Medicine | Admitting: Emergency Medicine

## 2022-06-04 ENCOUNTER — Other Ambulatory Visit: Payer: Self-pay

## 2022-06-04 ENCOUNTER — Encounter (HOSPITAL_COMMUNITY): Payer: Self-pay | Admitting: Emergency Medicine

## 2022-06-04 DIAGNOSIS — Z23 Encounter for immunization: Secondary | ICD-10-CM | POA: Diagnosis not present

## 2022-06-04 DIAGNOSIS — W3400XA Accidental discharge from unspecified firearms or gun, initial encounter: Secondary | ICD-10-CM | POA: Diagnosis not present

## 2022-06-04 DIAGNOSIS — S61214A Laceration without foreign body of right ring finger without damage to nail, initial encounter: Secondary | ICD-10-CM | POA: Insufficient documentation

## 2022-06-04 DIAGNOSIS — S6991XA Unspecified injury of right wrist, hand and finger(s), initial encounter: Secondary | ICD-10-CM | POA: Diagnosis present

## 2022-06-04 MED ORDER — IBUPROFEN 800 MG PO TABS: 800.0000 mg | ORAL_TABLET | Freq: Three times a day (TID) | ORAL | 0 refills | Status: DC

## 2022-06-04 MED ORDER — HYDROCODONE-ACETAMINOPHEN 5-325 MG PO TABS
1.0000 | ORAL_TABLET | Freq: Once | ORAL | Status: AC
  Administered 2022-06-04: 1 via ORAL
  Filled 2022-06-04: qty 1

## 2022-06-04 MED ORDER — IBUPROFEN 800 MG PO TABS
800.0000 mg | ORAL_TABLET | Freq: Once | ORAL | Status: AC
  Administered 2022-06-04: 800 mg via ORAL
  Filled 2022-06-04: qty 1

## 2022-06-04 MED ORDER — HYDROCODONE-ACETAMINOPHEN 5-325 MG PO TABS: 1.0000 | ORAL_TABLET | Freq: Once | ORAL | Status: DC

## 2022-06-04 MED ORDER — LIDOCAINE HCL (PF) 1 % IJ SOLN
30.0000 mL | Freq: Once | INTRAMUSCULAR | Status: AC
  Administered 2022-06-04: 30 mL
  Filled 2022-06-04: qty 30

## 2022-06-04 MED ORDER — CEFAZOLIN SODIUM-DEXTROSE 2-4 GM/100ML-% IV SOLN
2.0000 g | Freq: Once | INTRAVENOUS | Status: AC
  Administered 2022-06-04: 2 g via INTRAVENOUS
  Filled 2022-06-04: qty 100

## 2022-06-04 MED ORDER — TETANUS-DIPHTH-ACELL PERTUSSIS 5-2.5-18.5 LF-MCG/0.5 IM SUSY
0.5000 mL | PREFILLED_SYRINGE | Freq: Once | INTRAMUSCULAR | Status: AC
  Administered 2022-06-04: 0.5 mL via INTRAMUSCULAR
  Filled 2022-06-04: qty 0.5

## 2022-06-04 MED ORDER — CEPHALEXIN 500 MG PO CAPS: 500.0000 mg | ORAL_CAPSULE | Freq: Two times a day (BID) | ORAL | 0 refills | Status: DC

## 2022-06-04 NOTE — ED Notes (Signed)
Randal Buba, MD, Marlon Pel, [place] made aware of pt.
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
## 2022-06-04 NOTE — ED Notes (Signed)
RN reviewed discharge instructions with pt. Pt verbalized understanding and had no further questions. VSS upon discharge.  

## 2022-06-09 ENCOUNTER — Ambulatory Visit (INDEPENDENT_AMBULATORY_CARE_PROVIDER_SITE_OTHER): Admitting: Sports Medicine

## 2022-06-09 ENCOUNTER — Encounter: Payer: Self-pay | Admitting: Sports Medicine

## 2022-06-09 VITALS — Ht 62.0 in | Wt 185.0 lb

## 2022-06-09 DIAGNOSIS — S6991XA Unspecified injury of right wrist, hand and finger(s), initial encounter: Secondary | ICD-10-CM

## 2022-06-09 DIAGNOSIS — W3400XA Accidental discharge from unspecified firearms or gun, initial encounter: Secondary | ICD-10-CM | POA: Diagnosis not present

## 2022-06-09 NOTE — Patient Instructions (Signed)
Call [facility] --> follow-up with Dr. Sherilyn Cooter in the next 4-6 days
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
## 2024-08-25 ENCOUNTER — Encounter (HOSPITAL_BASED_OUTPATIENT_CLINIC_OR_DEPARTMENT_OTHER): Payer: Self-pay

## 2024-08-25 ENCOUNTER — Emergency Department (HOSPITAL_BASED_OUTPATIENT_CLINIC_OR_DEPARTMENT_OTHER)

## 2024-08-25 ENCOUNTER — Other Ambulatory Visit: Payer: Self-pay

## 2024-08-25 ENCOUNTER — Emergency Department (HOSPITAL_BASED_OUTPATIENT_CLINIC_OR_DEPARTMENT_OTHER)
Admission: EM | Admit: 2024-08-25 | Discharge: 2024-08-25 | Disposition: A | Discharge status: Home / Self Care | Attending: Emergency Medicine | Admitting: Emergency Medicine

## 2024-08-25 DIAGNOSIS — N6310 Unspecified lump in the right breast, unspecified quadrant: Secondary | ICD-10-CM | POA: Diagnosis not present

## 2024-08-25 DIAGNOSIS — R112 Nausea with vomiting, unspecified: Secondary | ICD-10-CM | POA: Diagnosis present

## 2024-08-25 DIAGNOSIS — N63 Unspecified lump in unspecified breast: Secondary | ICD-10-CM

## 2024-08-25 DIAGNOSIS — E860 Dehydration: Secondary | ICD-10-CM | POA: Diagnosis not present

## 2024-08-25 DIAGNOSIS — E876 Hypokalemia: Secondary | ICD-10-CM | POA: Insufficient documentation

## 2024-08-25 LAB — COMPREHENSIVE METABOLIC PANEL WITH GFR
ALT: 22 U/L (ref 0–44)
AST: 28 U/L (ref 15–41)
Albumin: 4.6 g/dL (ref 3.5–5.0)
Alkaline Phosphatase: 97 U/L (ref 38–126)
Anion gap: 17 — ABNORMAL HIGH (ref 5–15)
BUN: 15 mg/dL (ref 6–20)
CO2: 22 mmol/L (ref 22–32)
Calcium: 9.4 mg/dL (ref 8.9–10.3)
Chloride: 99 mmol/L (ref 98–111)
Creatinine, Ser: 0.92 mg/dL (ref 0.44–1.00)
GFR, Estimated: >60 mL/min
Glucose, Bld: 93 mg/dL (ref 70–99)
Potassium: 3 mmol/L — ABNORMAL LOW (ref 3.5–5.1)
Sodium: 137 mmol/L (ref 135–145)
Total Bilirubin: 1.3 mg/dL — ABNORMAL HIGH (ref 0.0–1.2)
Total Protein: 8.1 g/dL (ref 6.5–8.1)

## 2024-08-25 LAB — CBC
HCT: 38 % (ref 36.0–46.0)
Hemoglobin: 12.9 g/dL (ref 12.0–15.0)
MCH: 27.3 pg (ref 26.0–34.0)
MCHC: 33.9 g/dL (ref 30.0–36.0)
MCV: 80.3 fL (ref 80.0–100.0)
Platelets: 367 K/uL (ref 150–400)
RBC: 4.73 MIL/uL (ref 3.87–5.11)
RDW: 14.4 % (ref 11.5–15.5)
WBC: 16.8 K/uL — ABNORMAL HIGH (ref 4.0–10.5)
nRBC: 0 % (ref 0.0–0.2)

## 2024-08-25 LAB — RESP PANEL BY RT-PCR (RSV, FLU A&B, COVID)  RVPGX2
Influenza A by PCR: NEGATIVE
Influenza B by PCR: NEGATIVE
Resp Syncytial Virus by PCR: NEGATIVE
SARS Coronavirus 2 by RT PCR: NEGATIVE

## 2024-08-25 LAB — HCG, SERUM, QUALITATIVE: Preg, Serum: NEGATIVE

## 2024-08-25 LAB — LIPASE, BLOOD: Lipase: 43 U/L (ref 11–51)

## 2024-08-25 MED ORDER — DIPHENHYDRAMINE HCL 50 MG/ML IJ SOLN
25.0000 mg | Freq: Once | INTRAMUSCULAR | Status: AC
  Administered 2024-08-25: 25 mg via INTRAVENOUS
  Filled 2024-08-25: qty 1

## 2024-08-25 MED ORDER — SODIUM CHLORIDE 0.9 % IV BOLUS
1000.0000 mL | Freq: Once | INTRAVENOUS | Status: AC
  Administered 2024-08-25: 1000 mL via INTRAVENOUS

## 2024-08-25 MED ORDER — IOHEXOL 300 MG/ML  SOLN
100.0000 mL | Freq: Once | INTRAMUSCULAR | Status: AC | PRN
  Administered 2024-08-25: 100 mL via INTRAVENOUS

## 2024-08-25 MED ORDER — PROMETHAZINE HCL 25 MG RE SUPP: 25.0000 mg | Freq: Four times a day (QID) | RECTAL | 0 refills | Status: AC | PRN

## 2024-08-25 MED ORDER — PROMETHAZINE HCL 25 MG PO TABS: 25.0000 mg | ORAL_TABLET | Freq: Four times a day (QID) | ORAL | 0 refills | Status: AC | PRN

## 2024-08-25 MED ORDER — METOCLOPRAMIDE HCL 5 MG/ML IJ SOLN
5.0000 mg | Freq: Once | INTRAMUSCULAR | Status: AC
  Administered 2024-08-25: 5 mg via INTRAVENOUS
  Filled 2024-08-25: qty 2

## 2024-08-25 NOTE — Discharge Instructions (Addendum)
 You should return to the hospital if you experience return of persistent nausea and vomiting that does not resolve and does not allow you to tolerate any food or fluids, persistent fevers for greater than 2-3 more days, increasing abdominal pain that persists despite medications, persistent diarrhea, dizziness, syncope (fainting), or for any other concerns.    I prescribed a nausea medication called Phenergan , I have also sent a suppository version you can take if you are vomiting and unable to take the oral version.  Please follow-up your PCP in the next week for recheck and to reevaluate low potassium level which should normalize once your vomiting has subsided and you are able to tolerate normal oral intake.  Please return to the emergency department immediately for any new or concerning symptoms, or if you get worse.

## 2024-08-25 NOTE — ED Triage Notes (Signed)
 Pt to ED via POV with mother.  Pt reports /V x36 hours.  /V x5 times today and is unable to tolerate any PO.  Pt reports epigastric pain, 7/10.    Pt states she has been taking Ondansetron  SL, last dose 1830.

## 2024-08-25 NOTE — ED Notes (Signed)
 Patient provided a drink for PO challenge.

## 2024-08-25 NOTE — ED Provider Notes (Signed)
 " [facility] Provider Note  CSN: [id number] Arrival date & time: 08/25/24 2026  Chief Complaint(s) Emesis  HPI Stacey Franco is a 28 y.o. female without significant past medical history who presents to the ED with complaint of abdominal pain nausea vomiting diarrhea  Reports she drank alcohol in excess on Friday, Saturday began having nausea, vomiting, diarrhea, abdominal pain.  Zofran  prior to arrival which did not alleviate her symptoms.  Reduced p.o. intake secondary to nausea.  Nausea provoked by p.o. intake.  Emesis nonbloody nonbilious.  No BRBPR or melena.  No sick contacts recent travel.  No recent diet changes.  Denies daily alcohol use.  Past Medical History No past medical history on file. There are no active problems to display for this patient.  Home Medication(s) Prior to Admission medications  Medication Sig Start Date End Date Taking? Authorizing Provider  promethazine  (PHENERGAN ) 25 MG suppository Place 1 suppository (25 mg total) rectally every 6 (six) hours as needed for nausea or vomiting. 08/25/24  Yes Elnor Savant A, DO  promethazine  (PHENERGAN ) 25 MG tablet Take 1 tablet (25 mg total) by mouth every 6 (six) hours as needed for nausea or vomiting. 08/25/24  Yes Elnor Savant A, DO  PRESCRIPTION MEDICATION Take 1 tablet by mouth daily.    [provider]                                                                                                                                    Past Surgical History Past Surgical History:  Procedure Laterality Date   CYST EXCISION     breast   KNEE SURGERY     Family History No family history on file.  Social History Social History[1] Allergies Patient has no known allergies.  Review of Systems A thorough review of systems was obtained and all systems are negative except as noted in the HPI and PMH.   Physical Exam Vital Signs  I have reviewed the triage  vital signs BP (!) 168/107   Pulse 66   Temp 98.8 F (37.1 C) (Oral)   Resp 17   Ht 5' 2 (1.575 m)   Wt 73.9 kg   SpO2 100%   BMI 29.81 kg/m  Physical Exam Vitals and nursing note reviewed.  Constitutional:      General: She is not in acute distress.    Appearance: Normal appearance. She is well-developed. She is not ill-appearing.  HENT:     Head: Normocephalic and atraumatic.     Right Ear: External ear normal.     Left Ear: External ear normal.     Nose: Nose normal.     Mouth/Throat:     Mouth: Mucous membranes are moist.  Eyes:     General: No scleral icterus.       Right eye: No discharge.        Left eye: No  discharge.  Cardiovascular:     Rate and Rhythm: Normal rate.  Pulmonary:     Effort: Pulmonary effort is normal. No respiratory distress.     Breath sounds: No stridor.  Abdominal:     General: Abdomen is flat. There is no distension.     Palpations: Abdomen is soft.     Tenderness: There is generalized abdominal tenderness. There is no guarding.  Musculoskeletal:        General: No deformity.     Cervical back: No rigidity.  Skin:    General: Skin is warm and dry.     Coloration: Skin is not cyanotic, jaundiced or pale.  Neurological:     Mental Status: She is alert.  Psychiatric:        Speech: Speech normal.        Behavior: Behavior normal. Behavior is cooperative.     ED Results and Treatments Labs (all labs ordered are listed, but only abnormal results are displayed) Labs Reviewed  COMPREHENSIVE METABOLIC PANEL WITH GFR - Abnormal; Notable for the following components:      Result Value   Potassium 3.0 (*)    Total Bilirubin 1.3 (*)    Anion gap 17 (*)    All other components within normal limits  CBC - Abnormal; Notable for the following components:   WBC 16.8 (*)    All other components within normal limits  RESP PANEL BY RT-PCR (RSV, FLU A&B, COVID)  RVPGX2  LIPASE, BLOOD  HCG, SERUM, QUALITATIVE                                                                                                                           Radiology CT ABDOMEN PELVIS W CONTRAST Result Date: 08/25/2024 CLINICAL DATA:  Epi gastric pain EXAM: CT ABDOMEN AND PELVIS WITH CONTRAST TECHNIQUE: Multidetector CT imaging of the abdomen and pelvis was performed using the standard protocol following bolus administration of intravenous contrast. RADIATION DOSE REDUCTION: This exam was performed according to the departmental dose-optimization program which includes automated exposure control, adjustment of the mA and/or kV according to patient size and/or use of iterative reconstruction technique. CONTRAST:  OMNIPAQUE  IOHEXOL  300 MG/ML  SOLN COMPARISON:  Report 09/02/2011 FINDINGS: Lower chest: Lung bases demonstrate no acute airspace disease. 13 mm nodule in the lower right breast, series 2, image 3. Hepatobiliary: No focal liver abnormality is seen. No gallstones, gallbladder wall thickening, or biliary dilatation. Pancreas: Unremarkable. No pancreatic ductal dilatation or surrounding inflammatory changes. Spleen: Normal in size without focal abnormality. Adrenals/Urinary Tract: Adrenal glands are unremarkable. Kidneys are normal, without renal calculi, focal lesion, or hydronephrosis. Bladder is unremarkable. Stomach/Bowel: Stomach is within normal limits. Appendix appears normal. No evidence of bowel wall thickening, distention, or inflammatory changes. Mild diverticular disease of the left colon. Vascular/Lymphatic: No significant vascular findings are present. No enlarged abdominal or pelvic lymph nodes. Reproductive: No suspicious adnexal mass. Low lying IUD visualized in the lower uterine segment/cervix Other:  Negative for ascites or free air Musculoskeletal: No acute or suspicious osseous abnormality IMPRESSION: 1. No CT evidence for acute intra-abdominal or pelvic abnormality. 2. 13 mm nodule in the lower right breast. This may correspond to the nodule  biopsied in 2012 but recommend comparison with the patient's prior breast imaging. 3. Low lying IUD visualized in the lower uterine segment/cervix. Electronically Signed   By: Luke Bun M.D.   On: 08/25/2024 22:58    Pertinent labs & imaging results that were available during my care of the patient were reviewed by me and considered in my medical decision making (see MDM for details).  Medications Ordered in ED Medications  sodium chloride  0.9 % bolus 1,000 mL (0 mLs Intravenous Stopped 08/25/24 2233)  metoCLOPramide  (REGLAN ) injection 5 mg (5 mg Intravenous Given 08/25/24 2135)  diphenhydrAMINE  (BENADRYL ) injection 25 mg (25 mg Intravenous Given 08/25/24 2135)  iohexol  (OMNIPAQUE ) 300 MG/ML solution 100 mL (100 mLs Intravenous Contrast Given 08/25/24 2249)                                                                                                                                     Procedures Procedures  (including critical care time)  Medical Decision Making / ED Course    Medical Decision Making:    Stacey Franco is a 28 y.o. female without significant past medical history who presents to the ED with complaint of abdominal pain nausea vomiting diarrhea. The complaint involves an extensive differential diagnosis and also carries with it a high risk of complications and morbidity.  Serious etiology was considered. Ddx includes but is not limited to: Differential diagnosis includes but is not exclusive to acute cholecystitis, intrathoracic causes for epigastric abdominal pain, gastritis, duodenitis, pancreatitis, small bowel or large bowel obstruction, abdominal aortic aneurysm, hernia, gastritis, etc.   Complete initial physical exam performed, notably the patient was in no acute distress, appears uncomfortable.    Reviewed and confirmed nursing documentation for past medical history, family history, social history.  Vital signs reviewed.    Nausea, vomiting, abdominal  pain, diarrhea Recent heavy alcohol use> - Patient concerned she has alcohol poisoning, drinking excess on Friday, symptoms began Saturday morning.  Unrelieved with Zofran . -Give fluids, antiemetic - Labs w/ leukocytosis, recent emesis, chronic mild elev, favor 2/2 demargination. Doubt sepsis  - CTAP stable, discussed breast nodule w/ pt - feeling much better, tolerating po, ambulatory, HDS, stable for dc - encouraged abstinence from etoh, bland diet, rehydration, f/u pcp - k low, likely 2/2 poor po and emesis, advised f/u pcp for recheck   Clinical Course as of 08/26/24 1605  Sun Aug 25, 2024  2315 CT w/ breast nodule, recommend o/p f/u, o/w stable [SG]    Clinical Course User Index [SG] Elnor Jayson LABOR, DO      I have discussed the diagnosis/risks/treatment options with the patient and family.  Evaluation and diagnostic testing in the  emergency department does not suggest an emergent condition requiring admission or immediate intervention beyond what has been performed at this time.  They will follow up with pcp. We also discussed returning to the ED immediately if new or worsening sx occur. We discussed the sx which are most concerning (e.g., sudden worsening pain, fever, inability to tolerate by mouth) that necessitate immediate return.    The patient appears reasonably screened and/or stabilized for discharge and I doubt any other medical condition or other [facility] requiring further screening, evaluation, or treatment in the ED at this time prior to discharge.                 Additional history obtained: -Additional history obtained from family -External records from outside source obtained and reviewed including: Chart review including previous notes, labs, imaging, consultation notes including  Home meds   Lab Tests: -I ordered, reviewed, and interpreted labs.   The pertinent results include:   Labs Reviewed  COMPREHENSIVE METABOLIC PANEL WITH GFR - Abnormal; Notable  for the following components:      Result Value   Potassium 3.0 (*)    Total Bilirubin 1.3 (*)    Anion gap 17 (*)    All other components within normal limits  CBC - Abnormal; Notable for the following components:   WBC 16.8 (*)    All other components within normal limits  RESP PANEL BY RT-PCR (RSV, FLU A&B, COVID)  RVPGX2  LIPASE, BLOOD  HCG, SERUM, QUALITATIVE    Notable for as above  EKG   EKG Interpretation Date/Time:    Ventricular Rate:    PR Interval:    QRS Duration:    QT Interval:    QTC Calculation:   R Axis:      Text Interpretation:           Imaging Studies ordered: I ordered imaging studies including ctap I independently visualized the following imaging with scope of interpretation limited to determining acute life threatening conditions related to emergency care; findings noted above I agree with the radiologist interpretation If any imaging was obtained with contrast I closely monitored patient for any possible adverse reaction a/w contrast administration in the emergency department   Medicines ordered and prescription drug management: Meds ordered this encounter  Medications   sodium chloride  0.9 % bolus 1,000 mL   metoCLOPramide  (REGLAN ) injection 5 mg   diphenhydrAMINE  (BENADRYL ) injection 25 mg   iohexol  (OMNIPAQUE ) 300 MG/ML solution 100 mL   promethazine  (PHENERGAN ) 25 MG suppository    Sig: Place 1 suppository (25 mg total) rectally every 6 (six) hours as needed for nausea or vomiting.    Dispense:  6 each    Refill:  0   promethazine  (PHENERGAN ) 25 MG tablet    Sig: Take 1 tablet (25 mg total) by mouth every 6 (six) hours as needed for nausea or vomiting.    Dispense:  10 tablet    Refill:  0    -I have reviewed the patients home medicines and have made adjustments as needed   Consultations Obtained: na   Cardiac Monitoring: Continuous pulse oximetry interpreted by myself, 99% on RA.    Social Determinants of Health:   Diagnosis or treatment significantly limited by social determinants of health: na   Reevaluation: After the interventions noted above, I reevaluated the patient and found that they have improved  Co morbidities that complicate the patient evaluation No past medical history on file.    Dispostion: Disposition decision  including need for hospitalization was considered, and patient discharged from emergency department.    Final Clinical Impression(s) / ED Diagnoses Final diagnoses:  Nausea and vomiting, unspecified vomiting type  Hypokalemia  Dehydration  Breast nodule         [1]  Social History Tobacco Use   Smoking status: Never   Smokeless tobacco: Never  Vaping Use   Vaping status: Never Used  Substance Use Topics   Alcohol use: Yes    Comment: occasionally   Drug use: Yes    Frequency: 3.0 times per week    Types: Marijuana    Comment: used earlier today     Elnor Jayson LABOR, DO 08/26/24 1605  "
